# Patient Record
Sex: Female | Born: 1935 | Hispanic: No | Smoking: Never smoker
Health system: Southern US, Community
[De-identification: ages and names within clinical notes are randomized; demographics above are authoritative.]

## PROBLEM LIST (undated history)

## (undated) DIAGNOSIS — M1990 Unspecified osteoarthritis, unspecified site: Secondary | ICD-10-CM

## (undated) DIAGNOSIS — M549 Dorsalgia, unspecified: Secondary | ICD-10-CM

## (undated) DIAGNOSIS — M810 Age-related osteoporosis without current pathological fracture: Secondary | ICD-10-CM

## (undated) HISTORY — PX: JOINT REPLACEMENT: SHX530

## (undated) HISTORY — PX: BREAST SURGERY: SHX581

---

## 2005-03-03 ENCOUNTER — Emergency Department: Payer: Self-pay | Admitting: Emergency Medicine

## 2007-03-21 ENCOUNTER — Ambulatory Visit: Payer: Self-pay | Admitting: Orthopaedic Surgery

## 2007-11-22 ENCOUNTER — Emergency Department: Payer: Self-pay | Admitting: Internal Medicine

## 2009-09-03 ENCOUNTER — Emergency Department: Payer: Self-pay | Admitting: Emergency Medicine

## 2011-02-24 ENCOUNTER — Ambulatory Visit: Payer: Self-pay | Admitting: Ophthalmology

## 2011-03-08 ENCOUNTER — Ambulatory Visit: Payer: Self-pay | Admitting: Ophthalmology

## 2011-06-14 ENCOUNTER — Ambulatory Visit: Payer: Self-pay | Admitting: Ophthalmology

## 2012-06-27 ENCOUNTER — Emergency Department: Payer: Self-pay | Admitting: Emergency Medicine

## 2012-06-27 LAB — COMPREHENSIVE METABOLIC PANEL
Alkaline Phosphatase: 49 U/L — ABNORMAL LOW (ref 50–136)
Anion Gap: 8 (ref 7–16)
Bilirubin,Total: 0.3 mg/dL (ref 0.2–1.0)
Calcium, Total: 9.2 mg/dL (ref 8.5–10.1)
Chloride: 102 mmol/L (ref 98–107)
EGFR (Non-African Amer.): 40 — ABNORMAL LOW
Osmolality: 282 (ref 275–301)
SGOT(AST): 17 U/L (ref 15–37)
SGPT (ALT): 21 U/L (ref 12–78)
Sodium: 138 mmol/L (ref 136–145)
Total Protein: 6.9 g/dL (ref 6.4–8.2)

## 2012-06-27 LAB — TROPONIN I: Troponin-I: <0.02 ng/mL

## 2012-06-27 LAB — CBC
HCT: 38.6 % (ref 35.0–47.0)
MCH: 32.9 pg (ref 26.0–34.0)
MCHC: 33.6 g/dL (ref 32.0–36.0)
MCV: 98 fL (ref 80–100)
Platelet: 192 10*3/uL (ref 150–440)

## 2014-05-05 NOTE — Op Note (Signed)
PATIENT NAME:  Colleen Koch, Shanique J MR#:  [id number] DATE OF BIRTH:  August 22, 1925  DATE OF PROCEDURE:  06/14/2011  PREOPERATIVE DIAGNOSIS:  Cataract, right eye.   POSTOPERATIVE DIAGNOSIS:  Cataract, right eye.  PROCEDURE PERFORMED:  Extracapsular cataract extraction using phacoemulsification with placement of an Alcon SN6CWS, 18.5-diopter posterior chamber lens, serial [id number].  SURGEON:  Maylon PeppersSteven A. Lilian Fuhs, MD  ASSISTANT:  None.  ANESTHESIA:  4% lidocaine and 0.75% Marcaine in a 50/50 mixture with 10 units/mL of Vitrase added given as a peribulbar.  ANESTHESIOLOGIST:  Linward NatalAmy Rice, MD   COMPLICATIONS:  None.  ESTIMATED BLOOD LOSS:  Less than 1 mL.  DESCRIPTION OF PROCEDURE:  The patient was brought to the operating room and given a peribulbar block.  The patient was then prepped and draped in the usual fashion.  The vertical rectus muscles were imbricated using 5-0 silk sutures.  These sutures were then clamped to the sterile drapes as bridle sutures.  A limbal peritomy was performed extending two clock hours and hemostasis was obtained with cautery.  A partial thickness scleral groove was made at the surgical limbus and dissected anteriorly in a lamellar dissection using an Alcon crescent knife.  The anterior chamber was entered superonasally with a Superblade and through the lamellar dissection with a 2.6 mm keratome.  DisCoVisc was used to replace the aqueous and a continuous tear capsulorrhexis was carried out.  Hydrodissection and hydrodelineation were carried out with balanced salt and a 27 gauge canula.  The nucleus was rotated to confirm the effectiveness of the hydrodissection.  Phacoemulsification was carried out using a divide-and-conquer technique.  Total ultrasound time was 1 minute and 37 seconds with an average power of  15.7 percent, CDE of 23.83.  Irrigation/aspiration was used to remove the residual cortex.  DisCoVisc was used to inflate the capsule and the internal incision  was enlarged to 3 mm with the crescent knife.  The intraocular lens was folded and inserted into the capsular bag using the AcrySert Delivery System. Irrigation/aspiration was used to remove the residual DisCoVisc.  Miostat was injected into the anterior chamber through the paracentesis track to inflate the anterior chamber and induce miosis.  The wound was checked for leaks and none were found. The conjunctiva was closed with cautery and the bridle sutures were removed.  Two drops of 0.3% Vigamox were placed on the eye.   An eye shield was placed on the eye.  The patient was discharged to the recovery room in good condition.  ____________________________ Maylon PeppersSteven A. Liv Rallis, MD sad:cbb D: 06/14/2011 12:53:04 ET T: 06/14/2011 13:17:35 ET JOB#: [id number]  cc: Viviann SpareSteven A. Novelle Addair, MD, <Dictator> Erline LevineSTEVEN A Lynniah Janoski MD ELECTRONICALLY SIGNED 06/21/2011 13:31

## 2014-05-05 NOTE — Op Note (Signed)
PATIENT NAME:  Colleen Koch, [name] MR#:  [id number] DATE OF BIRTH:  1925-07-23  DATE OF PROCEDURE:  03/08/2011  PREOPERATIVE DIAGNOSIS:  Cataract, left eye.    POSTOPERATIVE DIAGNOSIS:  Cataract, left eye.  PROCEDURE PERFORMED:  Extracapsular cataract extraction using phacoemulsification with placement of an Alcon SN6CWS, 18.5-diopter posterior chamber lens, serial # [id number].  SURGEON:  Maylon PeppersSteven A. Akasia Ahmad, MD  ASSISTANT:  None.  ANESTHESIA:  4% lidocaine and 0.75% Marcaine in a 50/50 mixture with 10 units per mL of Hylenex , given as a peribulbar.  ANESTHESIOLOGIST:  Dr. Maisie Fushomas   COMPLICATIONS:  None.  ESTIMATED BLOOD LOSS:  Less than 1 mL.  DESCRIPTION OF PROCEDURE:  The patient was brought to the operating room and given a peribulbar block.  The patient was then prepped and draped in the usual fashion.  The vertical rectus muscles were imbricated using 5-0 silk sutures.  These sutures were then clamped to the sterile drapes as bridle sutures.  A limbal peritomy was performed extending two clock hours and hemostasis was obtained with cautery.  A partial thickness scleral groove was made at the surgical limbus and dissected anteriorly in a lamellar dissection using an Alcon crescent knife.  The anterior chamber was entered supero-temporally with a Superblade and through the lamellar dissection with a 2.6 mm keratome.  DisCoVisc was used to replace the aqueous and a continuous tear capsulorrhexis was carried out.  Hydrodissection and hydrodelineation were carried out with balanced salt and a 27 gauge canula.  The nucleus was rotated to confirm the effectiveness of the hydrodissection.  Phacoemulsification was carried out using a divide-and-conquer technique.  Total ultrasound time was 1 minute and 29 seconds with an average power of 10.5 percent, CDE 13.78.  Irrigation/aspiration was used to remove the residual cortex.  DisCoVisc was used to inflate the capsule and the internal incision was  enlarged to 3 mm with the crescent knife.  The intraocular lens was folded and inserted into the capsular bag using the Acrysert delivery system.  Irrigation/aspiration was used to remove the residual DisCoVisc.  Miostat was injected into the anterior chamber through the paracentesis track to inflate the anterior chamber and induce miosis.  The wound was checked for leaks and none were found. The conjunctiva was closed with cautery and the bridle sutures were removed.  Two drops of 0.3% Vigamox were placed on the eye.   An eye shield was placed on the eye.  The patient was discharged to the recovery room in good condition.  ____________________________ Maylon PeppersSteven A. Litisha Guagliardo, MD sad:drc D: 03/08/2011 12:49:38 ET T: 03/08/2011 13:04:49 ET JOB#: [id number]  cc: Viviann SpareSteven A. Wylee Dorantes, MD, <Dictator> Erline LevineSTEVEN A Breven Guidroz MD ELECTRONICALLY SIGNED 03/09/2011 15:08

## 2015-10-07 ENCOUNTER — Other Ambulatory Visit: Payer: Self-pay | Admitting: Physician Assistant

## 2015-10-07 DIAGNOSIS — M545 Low back pain: Secondary | ICD-10-CM

## 2015-10-18 ENCOUNTER — Encounter: Payer: Self-pay | Admitting: Radiology

## 2015-10-18 ENCOUNTER — Ambulatory Visit
Admission: RE | Admit: 2015-10-18 | Discharge: 2015-10-18 | Disposition: A | Discharge status: Home / Self Care | Payer: Medicare Other | Source: Ambulatory Visit | Attending: Physician Assistant | Admitting: Physician Assistant

## 2015-10-18 DIAGNOSIS — M4856XA Collapsed vertebra, not elsewhere classified, lumbar region, initial encounter for fracture: Secondary | ICD-10-CM | POA: Diagnosis not present

## 2015-10-18 DIAGNOSIS — M8938 Hypertrophy of bone, other site: Secondary | ICD-10-CM | POA: Diagnosis not present

## 2015-10-18 DIAGNOSIS — M5126 Other intervertebral disc displacement, lumbar region: Secondary | ICD-10-CM | POA: Diagnosis not present

## 2015-10-18 DIAGNOSIS — M545 Low back pain, unspecified: Secondary | ICD-10-CM

## 2016-07-11 ENCOUNTER — Emergency Department: Payer: Medicare Other

## 2016-07-11 ENCOUNTER — Emergency Department
Admission: EM | Admit: 2016-07-11 | Discharge: 2016-07-11 | Disposition: A | Discharge status: Home / Self Care | Payer: Medicare Other | Attending: Emergency Medicine | Admitting: Emergency Medicine

## 2016-07-11 ENCOUNTER — Encounter: Payer: Self-pay | Admitting: Emergency Medicine

## 2016-07-11 DIAGNOSIS — S81811A Laceration without foreign body, right lower leg, initial encounter: Secondary | ICD-10-CM | POA: Insufficient documentation

## 2016-07-11 DIAGNOSIS — Y999 Unspecified external cause status: Secondary | ICD-10-CM | POA: Insufficient documentation

## 2016-07-11 DIAGNOSIS — Y9301 Activity, walking, marching and hiking: Secondary | ICD-10-CM | POA: Insufficient documentation

## 2016-07-11 DIAGNOSIS — S0990XA Unspecified injury of head, initial encounter: Secondary | ICD-10-CM | POA: Diagnosis not present

## 2016-07-11 DIAGNOSIS — Y92012 Bathroom of single-family (private) house as the place of occurrence of the external cause: Secondary | ICD-10-CM | POA: Diagnosis not present

## 2016-07-11 DIAGNOSIS — W01198A Fall on same level from slipping, tripping and stumbling with subsequent striking against other object, initial encounter: Secondary | ICD-10-CM | POA: Insufficient documentation

## 2016-07-11 DIAGNOSIS — Z23 Encounter for immunization: Secondary | ICD-10-CM | POA: Insufficient documentation

## 2016-07-11 DIAGNOSIS — S8991XA Unspecified injury of right lower leg, initial encounter: Secondary | ICD-10-CM | POA: Diagnosis present

## 2016-07-11 HISTORY — DX: Dorsalgia, unspecified: M54.9

## 2016-07-11 MED ORDER — CLINDAMYCIN HCL 150 MG PO CAPS
300.0000 mg | ORAL_CAPSULE | Freq: Once | ORAL | Status: AC
  Administered 2016-07-11: 300 mg via ORAL
  Filled 2016-07-11: qty 2

## 2016-07-11 MED ORDER — CLINDAMYCIN HCL 300 MG PO CAPS: 300.0000 mg | ORAL_CAPSULE | Freq: Three times a day (TID) | ORAL | 0 refills | Status: AC

## 2016-07-11 MED ORDER — TETANUS-DIPHTH-ACELL PERTUSSIS 5-2.5-18.5 LF-MCG/0.5 IM SUSP
0.5000 mL | Freq: Once | INTRAMUSCULAR | Status: AC
  Administered 2016-07-11: 0.5 mL via INTRAMUSCULAR
  Filled 2016-07-11: qty 0.5

## 2016-07-11 NOTE — ED Notes (Addendum)
[phone]   Pt had this RN attempt both numbers, no answer at either

## 2016-07-11 NOTE — ED Notes (Signed)
Spoke w/ pts son on phone, stated that he would be able to pick pt up for D/C

## 2016-07-11 NOTE — ED Notes (Signed)
Patients son called for pick up

## 2016-07-11 NOTE — ED Provider Notes (Signed)
[facility] Emergency Department Provider Note  ____________________________________________  Time seen: Approximately 9:13 AM  I have reviewed the triage vital signs and the nursing notes.   HISTORY  Chief Complaint Laceration    HPI Colleen Koch is a 83 y.o. female that presents to the emergency department with right lower leg skin tear. Patient states that she tripped on a rug and hit her back on the water closet and her leg on the toilet. Patient states that she is extremely concerned about an infection. She states that she was hospitalized an unknown amount of years ago at [facility] for staph. Patient states that she is very worried about the dangers of MRSA. She is most concerned about all of the germs on the toilet seat that are now in her leg. She denies hitting head or losing consciousness. She denies shortness breath, chest pain, nausea, vomiting, abdominal pain.   Past Medical History:  Diagnosis Date  . Back pain     There are no active problems to display for this patient.   History reviewed. No pertinent surgical history.  Prior to Admission medications   Medication Sig Start Date End Date Taking? Authorizing Provider  clindamycin (CLEOCIN) 300 MG capsule Take 1 capsule (300 mg total) by mouth 3 (three) times daily. 07/11/16 07/21/16  Enid Derry, PA-C    Allergies Penicillins  History reviewed. No pertinent family history.  Social History Social History  Substance Use Topics  . Smoking status: Never Smoker  . Smokeless tobacco: Never Used  . Alcohol use Not on file     Review of Systems  Constitutional: No fever/chills Cardiovascular: No chest pain. Respiratory: No SOB. Gastrointestinal: No abdominal pain.  No nausea, no vomiting.  Musculoskeletal: Negative for musculoskeletal pain. Skin: Negative for rash, ecchymosis. Positive for abrasion.  Neurological: Negative for headaches, numbness or  tingling   ____________________________________________   PHYSICAL EXAM:  VITAL SIGNS: ED Triage Vitals  Enc Vitals Group     BP 07/11/16 0726 129/76     Pulse Rate 07/11/16 0726 73     Resp 07/11/16 0726 20     Temp 07/11/16 0726 97.8 F (36.6 C)     Temp Source 07/11/16 0726 Oral     SpO2 07/11/16 0726 97 %     Weight 07/11/16 0726 134 lb (60.8 kg)     Height 07/11/16 0726 5' 3.5" (1.613 m)     Head Circumference --      Peak Flow --      Pain Score 07/11/16 0732 0     Pain Loc --      Pain Edu? --      Excl. in GC? --      Constitutional: Alert and oriented. Talkative. Well appearing and in no acute distress. Eyes: Conjunctivae are normal. PERRL. EOMI. Head: Atraumatic. ENT:      Ears:      Nose: No congestion/rhinnorhea.      Mouth/Throat: Mucous membranes are moist.  Neck: No stridor.  Cardiovascular: Normal rate, regular rhythm.  Good peripheral circulation. Respiratory: Normal respiratory effort without tachypnea or retractions. Lungs CTAB. Good air entry to the bases with no decreased or absent breath sounds. Gastrointestinal: Bowel sounds 4 quadrants. Soft and nontender to palpation. No guarding or rigidity. No palpable masses. No distention. Musculoskeletal: Full range of motion to all extremities. No gross deformities appreciated. Neurologic:  Normal speech and language. No gross focal neurologic deficits are appreciated.  Skin:  Skin is warm, dry and  intact. 2 inch skin tear to right shin.    ____________________________________________   LABS (all labs ordered are listed, but only abnormal results are displayed)  Labs Reviewed - No data to display ____________________________________________  EKG   ____________________________________________  RADIOLOGY  Ct Head Wo Contrast  Result Date: 07/11/2016 CLINICAL DATA:  Fall in bathroom today. Head injury. Initial encounter. EXAM: CT HEAD WITHOUT CONTRAST TECHNIQUE: Contiguous axial images were  obtained from the base of the skull through the vertex without intravenous contrast. COMPARISON:  None. FINDINGS: Brain: No evidence of acute infarction, hemorrhage, hydrocephalus, extra-axial collection, or mass lesion/mass effect. Mild cerebral atrophy and chronic small vessel disease. Vascular:  No hyperdense vessel or other acute findings. Skull: No evidence of fracture or other significant bone abnormality. Sinuses/Orbits:  No acute findings. Other: None. IMPRESSION: No acute intracranial abnormality. Mild cerebral atrophy and chronic small vessel disease. Electronically Signed   By: Myles RosenthalJohn  Stahl M.D.   On: 07/11/2016 11:33    ____________________________________________    PROCEDURES  Procedure(s) performed:    Procedures  Wound was irrigated with 500 mL of normal saline and iodine. Skin flap was placed back over abrasion. Dermabond and Steri-Strips were applied to keep skin flap in place. I do not think that skin is thick enough for sutures. Bandage was applied and leg was wrapped.  Medications  Tdap (BOOSTRIX) injection 0.5 mL (0.5 mLs Intramuscular Given 07/11/16 0919)  clindamycin (CLEOCIN) capsule 300 mg (300 mg Oral Given 07/11/16 0919)     ____________________________________________   INITIAL IMPRESSION / ASSESSMENT AND PLAN / ED COURSE  Pertinent labs & imaging results that were available during my care of the patient were reviewed by me and considered in my medical decision making (see chart for details).  Review of the [place] CSRS was performed in accordance of the [facility] prior to dispensing any controlled drugs.  Patient's diagnosis is consistent with skin tear. Vital signs and exam are reassuring. After speaking with patient's son, he stated that patient has periods where she blacks out and is unlikely to be able to provide an accurate history. At this point CT head was ordered.  Skin flap was held in place with Dermabond and Steri-Strips. I do not think it is strong enough for  stitches. Bandage was applied and leg was wrapped. Tetanus shot was updated. Patient is very talkative and is eager to share stories and asked many questions. Patient will be discharged home with prescriptions for clindamycin. Patient is to follow up with PCP as directed. Patient is given ED precautions to return to the ED for any worsening or new symptoms.     ____________________________________________  FINAL CLINICAL IMPRESSION(S) / ED DIAGNOSES  Final diagnoses:  Noninfected skin tear of right lower extremity, initial encounter      NEW MEDICATIONS STARTED DURING THIS VISIT:  Discharge Medication List as of 07/11/2016 12:09 PM    START taking these medications   Details  clindamycin (CLEOCIN) 300 MG capsule Take 1 capsule (300 mg total) by mouth 3 (three) times daily., Starting Sun 07/11/2016, Until Wed 07/21/2016, Print            This chart was dictated using voice recognition software/Dragon. Despite best efforts to proofread, errors can occur which can change the meaning. Any change was purely unintentional.    Enid DerryWagner, Zaidyn Claire, PA-C 07/11/16 1345    Jene EveryKinner, Robert, MD 07/11/16 [phone]

## 2016-07-11 NOTE — ED Notes (Signed)
PA spoke w/ pts son.

## 2016-07-11 NOTE — ED Notes (Signed)
Patient transported to CT 

## 2016-07-11 NOTE — ED Notes (Signed)
See triage note...the patient c/o skin tear to posterior aspect of R calf. 2" skin tear noted, bleeding controlled and wound had been wrapped in triage. Pt A/OX4, drinking coffee. Pt denies n/v, LOC, dizziness or head injury. Pt sts fall mechanical in nature.  Motor function, sensation, circulation intact. Resp even and unlabored. NAD.

## 2016-07-11 NOTE — ED Triage Notes (Addendum)
Pt ambulatory to triage reports laceration to right lower leg that happened this morning at 5am.  Pt states the rug around the toilet seat slipped and she hit her back on the water closet and scraped her leg.  Pt concerned about infection in wound and placed H2O2 to cleanse. Pt is alert and oriented x 4. Pt denies any pain at this time. Wrapped leg in triage. 2 inch skin tear noted.  Bleeding controlled.

## 2016-07-28 ENCOUNTER — Emergency Department
Admission: EM | Admit: 2016-07-28 | Discharge: 2016-07-28 | Disposition: A | Discharge status: Home / Self Care | Payer: Medicare Other | Attending: Emergency Medicine | Admitting: Emergency Medicine

## 2016-07-28 DIAGNOSIS — Z4800 Encounter for change or removal of nonsurgical wound dressing: Secondary | ICD-10-CM | POA: Diagnosis present

## 2016-07-28 DIAGNOSIS — Z5321 Procedure and treatment not carried out due to patient leaving prior to being seen by health care provider: Secondary | ICD-10-CM | POA: Insufficient documentation

## 2016-07-28 HISTORY — DX: Unspecified osteoarthritis, unspecified site: M19.90

## 2016-07-28 NOTE — ED Triage Notes (Signed)
Pt states that she drove herself to the ER today.  Pt is a poor historian and unable to provide a good history or good information as to why she is here today.  Pt believes that she came to the ER for a wound recheck that she believes she was seen on June 1st for.  According to patient's chart pt was treated on July 1st for this wound.  Pt states adamently that she was seen on June 1st for this wound and was here all day.  Pt states that she has had [facility] in the [facility].  Pt very talkative and speaks in circles.  Unable to obtain any pertinent information from patient in triage.  Pt states that the wound's [facility], but that she doesn't believe that they have looked at the actual wound, but just changed the gauze outside of the wound.  Gauze is clean and dry with not drainage noted to bandage at this time.

## 2016-07-28 NOTE — ED Notes (Signed)
Pt is alert in lobby states she just needed her dressing changed on her leg  Pt drove herself and states she can be seen at urgent care in am

## 2016-10-03 ENCOUNTER — Emergency Department
Admission: EM | Admit: 2016-10-03 | Discharge: 2016-10-03 | Disposition: A | Discharge status: Home / Self Care | Payer: Medicare Other | Attending: Emergency Medicine | Admitting: Emergency Medicine

## 2016-10-03 DIAGNOSIS — T148XXA Other injury of unspecified body region, initial encounter: Secondary | ICD-10-CM

## 2016-10-03 DIAGNOSIS — Z4800 Encounter for change or removal of nonsurgical wound dressing: Secondary | ICD-10-CM | POA: Insufficient documentation

## 2016-10-03 DIAGNOSIS — Z5189 Encounter for other specified aftercare: Secondary | ICD-10-CM

## 2016-10-03 HISTORY — DX: Age-related osteoporosis without current pathological fracture: M81.0

## 2016-10-03 NOTE — ED Notes (Signed)
Pt reports that she fell this morning and injured the back of her left lower leg  - she states that the urgent care placed a bandage to her leg this morning but the area is still bleeding per pt so she came to the ER

## 2016-10-03 NOTE — ED Triage Notes (Signed)
Patient has skin tear to dorsal left leg. Patient was seen at [facility] today for same. Pt was unhappy with the quality of [facility]'s bandage/dressing. Pt re-bandaged wound. Pt continues to be unhappy with quality of bandage/dressing. Pt would like a new dressing/bandage.

## 2016-10-03 NOTE — ED Provider Notes (Signed)
[facility] Emergency Department Provider Note  ____________________________________________  Time seen: Approximately 11:08 PM  I have reviewed the triage vital signs and the nursing notes.   HISTORY  Chief Complaint Skin Tear    HPI Colleen Koch is a 83 y.o. female who presents emergency department complaining of skin tears to her left posterior leg. Patient reports that she suffered a fall with skin tears earlier today. She was seen in urgent care and dressing placed. Patient reports that the dressing fell off and was placed twice more. Patient reports the dressing has not stayed in place, can causing pain as well as some bleeding from the site. Patient denies any new injury. No other complaints at this time. Patient is requesting "a bandage that actually works."   Past Medical History:  Diagnosis Date  . Arthritis   . Back pain   . Osteoporosis     There are no active problems to display for this patient.   Past Surgical History:  Procedure Laterality Date  . BREAST SURGERY     implants  . JOINT REPLACEMENT      Prior to Admission medications   Not on File    Allergies Penicillins  No family history on file.  Social History Social History  Substance Use Topics  . Smoking status: Never Smoker  . Smokeless tobacco: Never Used  . Alcohol use No     Review of Systems  Constitutional: No fever/chills Eyes: No visual changes. Cardiovascular: no chest pain. Respiratory: no cough. No SOB. Gastrointestinal: No abdominal pain.  No nausea, no vomiting.   Musculoskeletal: Negative for musculoskeletal pain. Skin: positive for skin tears to the left posterior leg. Neurological: Negative for headaches, focal weakness or numbness. 10-point ROS otherwise negative.  ____________________________________________   PHYSICAL EXAM:  VITAL SIGNS: ED Triage Vitals  Enc Vitals Group     BP 10/03/16 2138 (!) 125/58     Pulse Rate 10/03/16  2138 83     Resp 10/03/16 2138 17     Temp 10/03/16 2138 98.1 F (36.7 C)     Temp Source 10/03/16 2138 Oral     SpO2 10/03/16 2138 94 %     Weight 10/03/16 2141 135 lb (61.2 kg)     Height --      Head Circumference --      Peak Flow --      Pain Score 10/03/16 2141 3     Pain Loc --      Pain Edu? --      Excl. in GC? --      Constitutional: Alert and oriented. Well appearing and in no acute distress. Eyes: Conjunctivae are normal. PERRL. EOMI. Head: Atraumatic. Neck: No stridor.    Cardiovascular: Normal rate, regular rhythm. Normal S1 and S2.  Good peripheral circulation. Respiratory: Normal respiratory effort without tachypnea or retractions. Lungs CTAB. Good air entry to the bases with no decreased or absent breath sounds. Musculoskeletal: Full range of motion to all extremities. No gross deformities appreciated. Neurologic:  Normal speech and language. No gross focal neurologic deficits are appreciated.  Skin:  Skin is warm, dry and intact. No rash noted.haphazard bandage noted to the left lower leg. This is removed to reveal multiple superficial skin tears over the posterior leg. No bleeding. No foreign body. No signs of infection. Skin tears start mid thigh and extend to mid calf. Psychiatric: Mood and affect are normal. Speech and behavior are normal. Patient exhibits appropriate insight and judgement.  ____________________________________________   LABS (all labs ordered are listed, but only abnormal results are displayed)  Labs Reviewed - No data to display ____________________________________________  EKG   ____________________________________________  RADIOLOGY   No results found.  ____________________________________________    PROCEDURES  Procedure(s) performed:    Procedures  The wound is cleansed thoroughly. Several areas covered with surgicept to promote wound healing. All lacerations covered with non-adherent dressing. Wrapped with ac  ebandage to keep nonadherent dressing inplace. Knee immobilizer placed to keep knee from bending and dressing from being removed. The patient is alerted to watch for any signs of infection (redness, pus, pain, increased swelling or fever) and call if such occurs. Home wound care instructions are provided.    Medications - No data to display   ____________________________________________   INITIAL IMPRESSION / ASSESSMENT AND PLAN / ED COURSE  Pertinent labs & imaging results that were available during my care of the patient were reviewed by me and considered in my medical decision making (see chart for details).  Review of the [place] CSRS was performed in accordance of the [facility] prior to dispensing any controlled drugs.     Patient's diagnosis is consistent with multiple skin tears with wound check. Area is thoroughly cleansed and redressed. No need for labs or imaging. No prescriptions at this time. Patient will follow up with primary care for further wound management. Patient is given ED precautions to return to the ED for any worsening or new symptoms.     ____________________________________________  FINAL CLINICAL IMPRESSION(S) / ED DIAGNOSES  Final diagnoses:  Multiple skin tears  Visit for wound check      NEW MEDICATIONS STARTED DURING THIS VISIT:  There are no discharge medications for this patient.       This chart was dictated using voice recognition software/Dragon. Despite best efforts to proofread, errors can occur which can change the meaning. Any change was purely unintentional.    Racheal Patches, PA-C 10/03/16 2350    Merrily Brittle, MD 10/04/16 1400

## 2016-12-07 ENCOUNTER — Emergency Department: Payer: Medicare Other

## 2016-12-07 ENCOUNTER — Other Ambulatory Visit: Payer: Self-pay

## 2016-12-07 ENCOUNTER — Emergency Department
Admission: EM | Admit: 2016-12-07 | Discharge: 2016-12-07 | Disposition: A | Discharge status: Home / Self Care | Payer: Medicare Other | Attending: Emergency Medicine | Admitting: Emergency Medicine

## 2016-12-07 DIAGNOSIS — I491 Atrial premature depolarization: Secondary | ICD-10-CM

## 2016-12-07 DIAGNOSIS — E039 Hypothyroidism, unspecified: Secondary | ICD-10-CM | POA: Diagnosis not present

## 2016-12-07 DIAGNOSIS — R531 Weakness: Secondary | ICD-10-CM | POA: Diagnosis present

## 2016-12-07 DIAGNOSIS — Z79899 Other long term (current) drug therapy: Secondary | ICD-10-CM | POA: Diagnosis not present

## 2016-12-07 LAB — CBC
HCT: 40.2 % (ref 35.0–47.0)
Hemoglobin: 13.3 g/dL (ref 12.0–16.0)
MCH: 32.1 pg (ref 26.0–34.0)
MCHC: 33.1 g/dL (ref 32.0–36.0)
MCV: 97.1 fL (ref 80.0–100.0)
PLATELETS: 201 10*3/uL (ref 150–440)
RBC: 4.14 MIL/uL (ref 3.80–5.20)
RDW: 12.9 % (ref 11.5–14.5)
WBC: 6.6 10*3/uL (ref 3.6–11.0)

## 2016-12-07 LAB — BASIC METABOLIC PANEL
Anion gap: 8 (ref 5–15)
BUN: 18 mg/dL (ref 6–20)
CALCIUM: 9.8 mg/dL (ref 8.9–10.3)
CHLORIDE: 104 mmol/L (ref 101–111)
CO2: 28 mmol/L (ref 22–32)
CREATININE: 1.01 mg/dL — AB (ref 0.44–1.00)
GFR calc Af Amer: 55 mL/min — ABNORMAL LOW (ref >60)
GFR calc non Af Amer: 48 mL/min — ABNORMAL LOW (ref >60)
GLUCOSE: 125 mg/dL — AB (ref 65–99)
Potassium: 4.4 mmol/L (ref 3.5–5.1)
Sodium: 140 mmol/L (ref 135–145)

## 2016-12-07 LAB — URINALYSIS, COMPLETE (UACMP) WITH MICROSCOPIC
Bacteria, UA: NONE SEEN
Bilirubin Urine: NEGATIVE
Glucose, UA: NEGATIVE mg/dL
Hgb urine dipstick: NEGATIVE
KETONES UR: NEGATIVE mg/dL
Leukocytes, UA: NEGATIVE
Nitrite: NEGATIVE
PH: 6 (ref 5.0–8.0)
PROTEIN: NEGATIVE mg/dL
Specific Gravity, Urine: 1.014 (ref 1.005–1.030)

## 2016-12-07 LAB — TROPONIN I: Troponin I: <0.03 ng/mL (ref <0.03)

## 2016-12-07 LAB — HEPATIC FUNCTION PANEL
ALT: 13 U/L — ABNORMAL LOW (ref 14–54)
AST: 17 U/L (ref 15–41)
Albumin: 3.8 g/dL (ref 3.5–5.0)
Alkaline Phosphatase: 45 U/L (ref 38–126)
BILIRUBIN DIRECT: 0.1 mg/dL (ref 0.1–0.5)
BILIRUBIN INDIRECT: 0.4 mg/dL (ref 0.3–0.9)
Total Bilirubin: 0.5 mg/dL (ref 0.3–1.2)
Total Protein: 7.1 g/dL (ref 6.5–8.1)

## 2016-12-07 LAB — T4, FREE: Free T4: 1.97 ng/dL — ABNORMAL HIGH (ref 0.61–1.12)

## 2016-12-07 LAB — TSH: TSH: 0.046 u[IU]/mL — ABNORMAL LOW (ref 0.350–4.500)

## 2016-12-07 MED ORDER — LEVOTHYROXINE SODIUM 50 MCG PO TABS: 50.0000 ug | ORAL_TABLET | Freq: Every day | ORAL | 1 refills | Status: AC

## 2016-12-07 MED ORDER — METOPROLOL TARTRATE 25 MG PO TABS
12.5000 mg | ORAL_TABLET | Freq: Two times a day (BID) | ORAL | 0 refills | Status: AC
Start: 2016-12-07 — End: 2017-12-07

## 2016-12-07 MED ORDER — METOPROLOL TARTRATE 25 MG PO TABS
12.5000 mg | ORAL_TABLET | Freq: Once | ORAL | Status: AC
  Administered 2016-12-07: 12.5 mg via ORAL
  Filled 2016-12-07: qty 1

## 2016-12-07 NOTE — Discharge Instructions (Signed)
Your EKG was concerning for an abnormal rhythm of your heart. Take metoprolol as prescribed. Reduce your dose of synthroid from 75 to 50 mcg daily as your hormone level is slightly elevated. Follow up with Cardiology in 2 days and with your doctor in 2 days. Return to the ER for chest pain, if you pass out, facial droop, slurred speech, unilateral weakness or numbness, fever, or any new symptoms concerning to you

## 2016-12-07 NOTE — ED Notes (Signed)
Patient's friend is here for transport. Patient was assisted to go to the room commode and get dressed.

## 2016-12-07 NOTE — ED Provider Notes (Signed)
[facility] Emergency Department Provider Note  ____________________________________________  Time seen: Approximately 1:51 PM  I have reviewed the triage vital signs and the nursing notes.   HISTORY  Chief Complaint Weakness   HPI Colleen Koch is a 83 y.o. female with a history of arthritis, osteoporosis, and hypothyroidism who presents for evaluation of generalized weakness. Patient reports that the symptoms have been ongoing for 2 years. She has several weekly episodes that she wakes up in the morning feeling very weak and has difficulty getting up from the bed. She has been seen by her primary care doctor with unclear etiology. Today she had similar episode and had a house guest who was concerned and brought her here for evaluation. Patient denies headache, URI symptoms, fever, cough, chest pain or shortness of breath, abdominal pain, nausea, vomiting, diarrhea, dysuria, rash. Patient reports that several days ago she fell onto her buttock region and is complaining of constant mild soreness in her coccyx region. No lower back pain, no saddle anesthesia, no urinary or bowel incontinence or retention. Patient denies unilateral weakness or numbness, facial droop, slurred speech, difficulty finding words.  Past Medical History:  Diagnosis Date  . Arthritis   . Back pain   . Osteoporosis     There are no active problems to display for this patient.   Past Surgical History:  Procedure Laterality Date  . BREAST SURGERY     implants  . JOINT REPLACEMENT      Prior to Admission medications   Medication Sig Start Date End Date Taking? Authorizing Provider  DULoxetine (CYMBALTA) 60 MG capsule Take 1 capsule by mouth daily. 01/20/14  Yes [provider]  meloxicam (MOBIC) 15 MG tablet Take 1 tablet by mouth daily.   Yes [provider]  simvastatin (ZOCOR) 20 MG tablet Take 1 tablet by mouth daily. 01/30/14  Yes [provider]    levothyroxine (SYNTHROID, LEVOTHROID) 50 MCG tablet Take 1 tablet (50 mcg total) by mouth daily. 12/07/16   [name], MD  metoprolol tartrate (LOPRESSOR) 25 MG tablet Take 0.5 tablets (12.5 mg total) by mouth 2 (two) times daily. 12/07/16 12/07/17  [name], MD    Allergies Penicillins  No family history on file.  Social History Social History   Tobacco Use  . Smoking status: Never Smoker  . Smokeless tobacco: Never Used  Substance Use Topics  . Alcohol use: No  . Drug use: Not on file    Review of Systems  Constitutional: Negative for fever. + generalized weakness Eyes: Negative for visual changes. ENT: Negative for sore throat. Neck: No neck pain  Cardiovascular: Negative for chest pain. Respiratory: Negative for shortness of breath. Gastrointestinal: Negative for abdominal pain, vomiting or diarrhea. Genitourinary: Negative for dysuria. Musculoskeletal: Negative for back pain. + coccyx pain Skin: Negative for rash. Neurological: Negative for headaches, weakness or numbness. Psych: No SI or HI  ____________________________________________   PHYSICAL EXAM:  VITAL SIGNS: ED Triage Vitals  Enc Vitals Group     BP 12/07/16 1105 137/85     Pulse Rate 12/07/16 1105 72     Resp 12/07/16 1105 18     Temp 12/07/16 1105 97.7 F (36.5 C)     Temp Source 12/07/16 1105 Oral     SpO2 12/07/16 1105 96 %     Weight 12/07/16 1106 134 lb (60.8 kg)     Height 12/07/16 1106 5\' 2"  (1.575 m)     Head Circumference --  Peak Flow --      Pain Score 12/07/16 1105 9     Pain Loc --      Pain Edu? --      Excl. in GC? --     Constitutional: Alert and oriented. Well appearing and in no apparent distress. HEENT:      Head: Normocephalic and atraumatic.         Eyes: Conjunctivae are normal. Sclera is non-icteric. R sided conjunctival hemorrhage      Mouth/Throat: Mucous membranes are moist.       Neck: Supple with no signs of  meningismus. Cardiovascular: Regular rate and rhythm. No murmurs, gallops, or rubs. 2+ symmetrical distal pulses are present in all extremities. No JVD. Respiratory: Normal respiratory effort. Lungs are clear to auscultation bilaterally. No wheezes, crackles, or rhonchi.  Gastrointestinal: Soft, non tender, and non distended with positive bowel sounds. No rebound or guarding. Musculoskeletal: Nontender with normal range of motion in all extremities. No edema, cyanosis, or erythema of extremities. Neurologic: Normal speech and language. A & O x3, PERRL, EOMI, no nystagmus, CN II-XII intact, motor testing reveals good tone and bulk throughout. There is no evidence of pronator drift or dysmetria. Muscle strength is 5/5 throughout. Sensory examination is intact. Gait deferred Skin: Skin is warm, dry and intact. No rash noted. Psychiatric: Mood and affect are normal. Speech and behavior are normal.  ____________________________________________   LABS (all labs ordered are listed, but only abnormal results are displayed)  Labs Reviewed  BASIC METABOLIC PANEL - Abnormal; Notable for the following components:      Result Value   Glucose, Bld 125 (*)    Creatinine, Ser 1.01 (*)    GFR calc non Af Amer 48 (*)    GFR calc Af Amer 55 (*)    All other components within normal limits  URINALYSIS, COMPLETE (UACMP) WITH MICROSCOPIC - Abnormal; Notable for the following components:   Color, Urine YELLOW (*)    APPearance CLEAR (*)    Squamous Epithelial / LPF 0-5 (*)    All other components within normal limits  TSH - Abnormal; Notable for the following components:   TSH 0.046 (*)    All other components within normal limits  T4, FREE - Abnormal; Notable for the following components:   Free T4 1.97 (*)    All other components within normal limits  HEPATIC FUNCTION PANEL - Abnormal; Notable for the following components:   ALT 13 (*)    All other components within normal limits  CBC  TROPONIN I   TROPONIN I  CBG MONITORING, ED   ____________________________________________  EKG  ED ECG REPORT I, Nita Sicklearolina Michaelyn Wall, the attending physician, personally viewed and interpreted this ECG.  Sinus rhythm with ectopic atrial rhythm, rate of 88, normal intervals, normal axis, no ST elevations or depressions. Afib is new when compared to prior.  15:05 - sinus rhythm with ectopic atrial rhythm, rate 78, normal intervals, normal axis, no ST elevations or depressions. ____________________________________________  RADIOLOGY  XR coccyx: negative ____________________________________________   PROCEDURES  Procedure(s) performed: None Procedures Critical Care performed:  None ____________________________________________   INITIAL IMPRESSION / ASSESSMENT AND PLAN / ED COURSE  83 y.o. female with a history of arthritis, osteoporosis, and hypothyroidism who presents for evaluation of generalized weakness x 2 years. patient is well-appearing, in no distress, normal vital signs, EKG showing new atrial ectopic rhythm with well-controlled rate, patient is neurologically intact with no evidence of stroke. we'll check TSH to rule  out thyroid abnormalities, we'll check basic labs to rule out anemia, electrolyte abnormalities, or ischemia. X-ray of the sacrum was negative for fracture. Will monitor on telemetry.    _________________________ 3:43 PM on 12/07/2016 -----------------------------------------  Labs showing slightly elevated free T4. We'll decrease patient's dose of levothyroxine. Patient has a new ectopic atrial rhythm on EKG. I discussed that with Dr. Juliann Paresallwood who agrees this is not afib and no anticoagulation is needed. He recommended starting patient on low dose BB 12.5mg  BID of metoprolol to help with atrial ectopy and f/u as outpatient. Patient was given first dose of metoprolol here and tolerated well. Will refer patient also to her PCP for further management.    As part of my  medical decision making, I reviewed the following data within the electronic MEDICAL RECORD NUMBER Nursing notes reviewed and incorporated, Labs reviewed , EKG interpreted , Old EKG reviewed, Old chart reviewed, A consult was requested and obtained from this/these consultant(s) cardiology, Notes from prior ED visits and [place] Controlled Substance Database    Pertinent labs & imaging results that were available during my care of the patient were reviewed by me and considered in my medical decision making (see chart for details).    ____________________________________________   FINAL CLINICAL IMPRESSION(S) / ED DIAGNOSES  Final diagnoses:  Atrial ectopy      NEW MEDICATIONS STARTED DURING THIS VISIT:  ED Discharge Orders        Ordered    levothyroxine (SYNTHROID, LEVOTHROID) 50 MCG tablet  Daily     12/07/16 1548    metoprolol tartrate (LOPRESSOR) 25 MG tablet  2 times daily     12/07/16 1548       Note:  This document was prepared using Dragon voice recognition software and may include unintentional dictation errors.    Don PerkingVeronese, [place], MD 12/07/16 [phone]

## 2016-12-07 NOTE — ED Notes (Signed)
Pt given warm blankets.

## 2016-12-07 NOTE — ED Notes (Signed)
First Nurse: pt c/o weakness for two years.

## 2016-12-07 NOTE — ED Triage Notes (Signed)
Pt c/o weakness and falling over the past year and has seen here PCP , when asked if there is anything new pt denies , states "Im limp as a dish rag". States her PCP "knows I flops around".

## 2016-12-07 NOTE — ED Notes (Signed)
Pt given meal tray and cup of water. Pt being transported to XR at this time.

## 2016-12-07 NOTE — ED Notes (Signed)
Room darkened per patient's request. NAD.

## 2016-12-07 NOTE — ED Notes (Signed)
Patient gave permission to call her friend Landry MellowSteve Layne to give her a ride home. Layne said he would be here under 30 minutes.

## 2017-03-06 ENCOUNTER — Other Ambulatory Visit: Payer: Self-pay

## 2017-03-06 ENCOUNTER — Emergency Department: Payer: Medicare Other

## 2017-03-06 ENCOUNTER — Emergency Department
Admission: EM | Admit: 2017-03-06 | Discharge: 2017-03-06 | Disposition: A | Discharge status: Home / Self Care | Payer: Medicare Other | Attending: Emergency Medicine | Admitting: Emergency Medicine

## 2017-03-06 ENCOUNTER — Encounter: Payer: Self-pay | Admitting: Emergency Medicine

## 2017-03-06 DIAGNOSIS — K859 Acute pancreatitis without necrosis or infection, unspecified: Secondary | ICD-10-CM | POA: Insufficient documentation

## 2017-03-06 DIAGNOSIS — Z79899 Other long term (current) drug therapy: Secondary | ICD-10-CM | POA: Diagnosis not present

## 2017-03-06 DIAGNOSIS — N390 Urinary tract infection, site not specified: Secondary | ICD-10-CM | POA: Insufficient documentation

## 2017-03-06 DIAGNOSIS — R112 Nausea with vomiting, unspecified: Secondary | ICD-10-CM

## 2017-03-06 DIAGNOSIS — R197 Diarrhea, unspecified: Secondary | ICD-10-CM | POA: Diagnosis not present

## 2017-03-06 LAB — URINALYSIS, COMPLETE (UACMP) WITH MICROSCOPIC
BILIRUBIN URINE: NEGATIVE
Glucose, UA: NEGATIVE mg/dL
Hgb urine dipstick: NEGATIVE
KETONES UR: NEGATIVE mg/dL
Nitrite: NEGATIVE
PROTEIN: 100 mg/dL — AB
Specific Gravity, Urine: 1.011 (ref 1.005–1.030)
pH: 6 (ref 5.0–8.0)

## 2017-03-06 LAB — CBC
HEMATOCRIT: 38.5 % (ref 35.0–47.0)
Hemoglobin: 12.8 g/dL (ref 12.0–16.0)
MCH: 32.1 pg (ref 26.0–34.0)
MCHC: 33.4 g/dL (ref 32.0–36.0)
MCV: 96.3 fL (ref 80.0–100.0)
Platelets: 201 10*3/uL (ref 150–440)
RBC: 4 MIL/uL (ref 3.80–5.20)
RDW: 14 % (ref 11.5–14.5)
WBC: 6.1 10*3/uL (ref 3.6–11.0)

## 2017-03-06 LAB — COMPREHENSIVE METABOLIC PANEL
ALT: 12 U/L — ABNORMAL LOW (ref 14–54)
AST: 20 U/L (ref 15–41)
Albumin: 4 g/dL (ref 3.5–5.0)
Alkaline Phosphatase: 38 U/L (ref 38–126)
Anion gap: 7 (ref 5–15)
BUN: 23 mg/dL — ABNORMAL HIGH (ref 6–20)
CHLORIDE: 103 mmol/L (ref 101–111)
CO2: 26 mmol/L (ref 22–32)
Calcium: 9.1 mg/dL (ref 8.9–10.3)
Creatinine, Ser: 0.89 mg/dL (ref 0.44–1.00)
GFR calc Af Amer: >60 mL/min (ref >60)
GFR, EST NON AFRICAN AMERICAN: 56 mL/min — AB (ref >60)
Glucose, Bld: 119 mg/dL — ABNORMAL HIGH (ref 65–99)
POTASSIUM: 3.9 mmol/L (ref 3.5–5.1)
SODIUM: 136 mmol/L (ref 135–145)
Total Bilirubin: 0.5 mg/dL (ref 0.3–1.2)
Total Protein: 7.1 g/dL (ref 6.5–8.1)

## 2017-03-06 LAB — LIPASE, BLOOD: Lipase: 137 U/L — ABNORMAL HIGH (ref 11–51)

## 2017-03-06 MED ORDER — SODIUM CHLORIDE 0.9 % IV BOLUS (SEPSIS)
1000.0000 mL | Freq: Once | INTRAVENOUS | Status: AC
  Administered 2017-03-06: 1000 mL via INTRAVENOUS

## 2017-03-06 MED ORDER — ONDANSETRON 4 MG PO TBDP: 4.0000 mg | ORAL_TABLET | Freq: Three times a day (TID) | ORAL | 0 refills | Status: AC | PRN

## 2017-03-06 MED ORDER — NITROFURANTOIN MONOHYD MACRO 100 MG PO CAPS
100.0000 mg | ORAL_CAPSULE | Freq: Once | ORAL | Status: AC
  Administered 2017-03-06: 100 mg via ORAL
  Filled 2017-03-06: qty 1

## 2017-03-06 MED ORDER — NITROFURANTOIN MONOHYD MACRO 100 MG PO CAPS: 100.0000 mg | ORAL_CAPSULE | Freq: Two times a day (BID) | ORAL | 0 refills | Status: AC

## 2017-03-06 NOTE — ED Notes (Signed)
Pt taken to car via Wheelchair. VSS. NAD. Discharge instructions, RX and follow up discussed with pt. All questions answered.

## 2017-03-06 NOTE — ED Triage Notes (Signed)
Pt arrives to triage with c/o N/V/D x 2 hours. Pt states "I just don't feel good but I didn't feel good to begin with". Pt is in NAD at this time.

## 2017-03-06 NOTE — ED Notes (Signed)
Patient states she came tonight because "I just didn't feel right".  Patient reports having some nausea, vomiting and diarrhea.  Patient states she has not vomited since coming to the ED, but has had some diarrhea x 3 since getting to the ED.  Patient denies any type of pain at this time.  Patient does report that she is incontinent of urine "I dribble".

## 2017-03-06 NOTE — ED Provider Notes (Signed)
[facility] Emergency Department Provider Note   ____________________________________________   First MD Initiated Contact with Patient 03/06/17 [phone]     (approximate)  I have reviewed the triage vital signs and the nursing notes.   HISTORY  Chief Complaint Emesis    HPI Colleen Koch is a 84 y.o. female who presents to the ED from home with a chief complaint of nausea, vomiting and diarrhea.  Patient thinks she has food poisoning from scrambled eggs she ate earlier in the evening.  Denies abdominal pain.  Denies associated fever, chills, chest pain, shortness of breath.  Has some dysuria.  Denies recent travel or trauma.  Denies recent antibiotic use.   Past Medical History:  Diagnosis Date  . Arthritis   . Back pain   . Osteoporosis     There are no active problems to display for this patient.   Past Surgical History:  Procedure Laterality Date  . BREAST SURGERY     implants  . JOINT REPLACEMENT      Prior to Admission medications   Medication Sig Start Date End Date Taking? Authorizing Provider  DULoxetine (CYMBALTA) 60 MG capsule Take 1 capsule by mouth daily. 01/20/14   [provider]  levothyroxine (SYNTHROID, LEVOTHROID) 50 MCG tablet Take 1 tablet (50 mcg total) by mouth daily. 12/07/16   [name], MD  meloxicam (MOBIC) 15 MG tablet Take 1 tablet by mouth daily.    [provider]  metoprolol tartrate (LOPRESSOR) 25 MG tablet Take 0.5 tablets (12.5 mg total) by mouth 2 (two) times daily. 12/07/16 12/07/17  [name], MD  nitrofurantoin, macrocrystal-monohydrate, (MACROBID) 100 MG capsule Take 1 capsule (100 mg total) by mouth 2 (two) times daily. 03/06/17   Irean Hong, MD  ondansetron (ZOFRAN ODT) 4 MG disintegrating tablet Take 1 tablet (4 mg total) by mouth every 8 (eight) hours as needed for nausea or vomiting. 03/06/17   Irean Hong, MD  simvastatin (ZOCOR) 20 MG tablet Take 1 tablet by mouth  daily. 01/30/14   [provider]    Allergies Penicillins  No family history on file.  Social History Social History   Tobacco Use  . Smoking status: Never Smoker  . Smokeless tobacco: Never Used  Substance Use Topics  . Alcohol use: No  . Drug use: No    Review of Systems  Constitutional: No fever/chills. Eyes: No visual changes. ENT: No sore throat. Cardiovascular: Denies chest pain. Respiratory: Denies shortness of breath. Gastrointestinal: No abdominal pain.  Positive for nausea, vomiting and diarrhea.  No constipation. Genitourinary: Negative for dysuria. Musculoskeletal: Negative for back pain. Skin: Negative for rash. Neurological: Negative for headaches, focal weakness or numbness.   ____________________________________________   PHYSICAL EXAM:  VITAL SIGNS: ED Triage Vitals  Enc Vitals Group     BP 03/06/17 0210 (!) 133/104     Pulse Rate 03/06/17 0210 72     Resp 03/06/17 0210 18     Temp 03/06/17 0210 (!) 97.5 F (36.4 C)     Temp Source 03/06/17 0210 Oral     SpO2 03/06/17 0210 96 %     Weight 03/06/17 0159 134 lb (60.8 kg)     Height 03/06/17 0159 5\' 3"  (1.6 m)     Head Circumference --      Peak Flow --      Pain Score 03/06/17 0159 0     Pain Loc --      Pain Edu? --  Excl. in GC? --     Constitutional: Alert and oriented. Well appearing and in no acute distress. Eyes: Conjunctivae are normal. PERRL. EOMI. Head: Atraumatic. Nose: No congestion/rhinnorhea. Mouth/Throat: Mucous membranes are mildly dry.  Oropharynx non-erythematous. Neck: No stridor.   Cardiovascular: Normal rate, regular rhythm. Grossly normal heart sounds.  Good peripheral circulation. Respiratory: Normal respiratory effort.  No retractions. Lungs CTAB. Gastrointestinal: Soft and minimally tender to palpation epigastrium without rebound or guarding. No distention. No abdominal bruits. No CVA tenderness. Musculoskeletal: No lower extremity tenderness nor  edema.  No joint effusions. Neurologic:  Normal speech and language. No gross focal neurologic deficits are appreciated. No gait instability. Skin:  Skin is warm, dry and intact. No rash noted. Psychiatric: Mood and affect are normal. Speech and behavior are normal.  ____________________________________________   LABS (all labs ordered are listed, but only abnormal results are displayed)  Labs Reviewed  LIPASE, BLOOD - Abnormal; Notable for the following components:      Result Value   Lipase 137 (*)    All other components within normal limits  COMPREHENSIVE METABOLIC PANEL - Abnormal; Notable for the following components:   Glucose, Bld 119 (*)    BUN 23 (*)    ALT 12 (*)    GFR calc non Af Amer 56 (*)    All other components within normal limits  URINALYSIS, COMPLETE (UACMP) WITH MICROSCOPIC - Abnormal; Notable for the following components:   Color, Urine YELLOW (*)    APPearance HAZY (*)    Protein, ur 100 (*)    Leukocytes, UA TRACE (*)    Bacteria, UA MANY (*)    Squamous Epithelial / LPF 0-5 (*)    All other components within normal limits  URINE CULTURE  CBC   ____________________________________________  EKG  ED ECG REPORT I, Haley Roza J, the attending physician, personally viewed and interpreted this ECG.   Date: 03/06/2017  EKG Time: 0200  Rate: 70  Rhythm: normal EKG, normal sinus rhythm  Axis: Normal  Intervals:none  ST&T Change: Nonspecific  ____________________________________________  RADIOLOGY  ED MD interpretation: Ultrasound pending  Official radiology report(s): No results found.  ____________________________________________   PROCEDURES  Procedure(s) performed: None  Procedures  Critical Care performed: No  ____________________________________________   INITIAL IMPRESSION / ASSESSMENT AND PLAN / ED COURSE  As part of my medical decision making, I reviewed the following data within the electronic MEDICAL RECORD NUMBER Nursing  notes reviewed and incorporated, Labs reviewed, EKG interpreted, Old chart reviewed and Notes from prior ED visits.   84 year old female who presents with nausea, vomiting and diarrhea. Differential diagnosis includes, but is not limited to, biliary disease (biliary colic, acute cholecystitis, cholangitis, choledocholithiasis, etc), intrathoracic causes for epigastric abdominal pain including ACS, gastritis, duodenitis, pancreatitis, small bowel or large bowel obstruction, abdominal aortic aneurysm, hernia, gastroenteritis and gastritis.  Patient has waited over 4 hours for treatment room and has not had vomiting in that time.  Nor has she had diarrhea.  Laboratory and urinalysis results remarkable for elevated lipase and mild UTI.  Will initiate IV fluid resuscitation, Macrobid for UTI, IV antiemetic and obtain right upper quadrant abdominal ultrasound to evaluate for cholecystitis.  Care will be transferred to the oncoming provider.  If ultrasound is unremarkable and patient remains well-appearing, anticipate she would be able to be discharged home on antibiotics and antiemetic.      ____________________________________________   FINAL CLINICAL IMPRESSION(S) / ED DIAGNOSES  Final diagnoses:  Nausea vomiting and diarrhea  Lower  urinary tract infectious disease  Acute pancreatitis without infection or necrosis, unspecified pancreatitis type     ED Discharge Orders        Ordered    nitrofurantoin, macrocrystal-monohydrate, (MACROBID) 100 MG capsule  2 times daily     03/06/17 0643    ondansetron (ZOFRAN ODT) 4 MG disintegrating tablet  Every 8 hours PRN     03/06/17 [id number]       Note:  This document was prepared using Dragon voice recognition software and may include unintentional dictation errors.    Irean HongSung, Norrin Shreffler J, MD 03/06/17 [phone]

## 2017-03-06 NOTE — ED Notes (Signed)
Pt assisted to bathroom at this time. Pt is able to ambulate without difficulty to and from the toilet.

## 2017-03-06 NOTE — Discharge Instructions (Addendum)
From Dr. Dolores FrameSung  1. Take antibiotic as prescribed (Macrobid 100mg  twice daily x 5 days). 2. You may take nausea medicine as needed (Zofran). 3.  Clear liquids for the next 12 hours, then bland diet for the next 3 days.  Avoid greasy, fatty, spicy foods and alcohol. 4.  Return to the ER for worsening symptoms, persistent vomiting, difficulty breathing or other concerns.

## 2017-03-08 LAB — URINE CULTURE: Culture: >=100000 — AB

## 2017-07-01 ENCOUNTER — Emergency Department: Payer: Medicare Other

## 2017-07-01 ENCOUNTER — Other Ambulatory Visit: Payer: Self-pay

## 2017-07-01 ENCOUNTER — Emergency Department
Admission: EM | Admit: 2017-07-01 | Discharge: 2017-07-01 | Disposition: A | Discharge status: Home / Self Care | Payer: Medicare Other | Attending: Emergency Medicine | Admitting: Emergency Medicine

## 2017-07-01 DIAGNOSIS — W19XXXA Unspecified fall, initial encounter: Secondary | ICD-10-CM | POA: Insufficient documentation

## 2017-07-01 DIAGNOSIS — R0789 Other chest pain: Secondary | ICD-10-CM | POA: Diagnosis not present

## 2017-07-01 DIAGNOSIS — Y92009 Unspecified place in unspecified non-institutional (private) residence as the place of occurrence of the external cause: Secondary | ICD-10-CM | POA: Diagnosis not present

## 2017-07-01 DIAGNOSIS — E039 Hypothyroidism, unspecified: Secondary | ICD-10-CM | POA: Insufficient documentation

## 2017-07-01 DIAGNOSIS — Y999 Unspecified external cause status: Secondary | ICD-10-CM | POA: Insufficient documentation

## 2017-07-01 DIAGNOSIS — S0990XA Unspecified injury of head, initial encounter: Secondary | ICD-10-CM

## 2017-07-01 DIAGNOSIS — Z79899 Other long term (current) drug therapy: Secondary | ICD-10-CM | POA: Insufficient documentation

## 2017-07-01 DIAGNOSIS — Y939 Activity, unspecified: Secondary | ICD-10-CM | POA: Diagnosis not present

## 2017-07-01 LAB — URINALYSIS, COMPLETE (UACMP) WITH MICROSCOPIC
Bilirubin Urine: NEGATIVE
GLUCOSE, UA: NEGATIVE mg/dL
HGB URINE DIPSTICK: NEGATIVE
KETONES UR: NEGATIVE mg/dL
Leukocytes, UA: NEGATIVE
NITRITE: NEGATIVE
PROTEIN: NEGATIVE mg/dL
Specific Gravity, Urine: 1.013 (ref 1.005–1.030)
pH: 8 (ref 5.0–8.0)

## 2017-07-01 LAB — CBC
HCT: 36.6 % (ref 35.0–47.0)
HEMOGLOBIN: 12.6 g/dL (ref 12.0–16.0)
MCH: 33.4 pg (ref 26.0–34.0)
MCHC: 34.5 g/dL (ref 32.0–36.0)
MCV: 96.8 fL (ref 80.0–100.0)
Platelets: 204 10*3/uL (ref 150–440)
RBC: 3.78 MIL/uL — AB (ref 3.80–5.20)
RDW: 13.6 % (ref 11.5–14.5)
WBC: 7.5 10*3/uL (ref 3.6–11.0)

## 2017-07-01 LAB — BASIC METABOLIC PANEL
ANION GAP: 8 (ref 5–15)
BUN: 12 mg/dL (ref 6–20)
CHLORIDE: 104 mmol/L (ref 101–111)
CO2: 28 mmol/L (ref 22–32)
Calcium: 9.4 mg/dL (ref 8.9–10.3)
Creatinine, Ser: 0.92 mg/dL (ref 0.44–1.00)
GFR calc non Af Amer: 53 mL/min — ABNORMAL LOW (ref >60)
Glucose, Bld: 93 mg/dL (ref 65–99)
POTASSIUM: 4.3 mmol/L (ref 3.5–5.1)
SODIUM: 140 mmol/L (ref 135–145)

## 2017-07-01 MED ORDER — ACETAMINOPHEN 500 MG PO TABS
1000.0000 mg | ORAL_TABLET | Freq: Once | ORAL | Status: AC
  Administered 2017-07-01: 1000 mg via ORAL
  Filled 2017-07-01: qty 2

## 2017-07-01 NOTE — Discharge Instructions (Signed)
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
## 2017-07-01 NOTE — Clinical Social Work Note (Addendum)
CSW received a phone call from [facility] Social Worker [place] [phone]), who stated patient just came to the ED because of chest pains and a recent fall. Ms. Cliffton AstersWhite concerned about possible broken ribs. Ms. Cliffton AstersWhite stated patient from home with son, but son does not provide care for the patient. Ms. Cliffton AstersWhite states patient has been having more frequent falls in the home and has not been taking her medication. Ms. Cliffton AstersWhite stated she does not feel patient is safe returning home. Ms. Cliffton AstersWhite stated patient's other son/POA-Steve Stubblefield [phone]) is on the way from his home in [place] and plans to place patient in an Assisted Living Facility.   CSW explained that at this time patient just arrived and no plan of care is available at this time. CSW also explained that at the point of discharge there would need to be a plan in place for patient. Ms. Cliffton AstersWhite expressed understanding. Patient does not have Medicaid, but does have traditional Medicare, which requires a 3 midnight, qualifying inpatient stay to cover short term rehab in a Skilled Nursing Facility (SNF). CSW updated EDP Dr. Don PerkingVeronese.   2:40pm - CSW received a call from EDP Dr. Don PerkingVeronese stating patient is medically cleared for discharge. CSW called patient's son/POA-Steve Stubblefield ([phone]), who stated he has been traveling to [place] since he "got the call this morning and [place] right now, which [place] about 6 hours away." Mr. Mable ParisStubblefield stated patient's youngest brother-Tim Yetta BarreJones is on the way to the hospital from [place] now to transport patient home. Mr. Mable ParisStubblefield stated Mr. Yetta BarreJones will be at the home with patient until Mr. Mable ParisStubblefield arrives. CSW informed Mr. Mable ParisStubblefield that CSW would inform APS SW Ms. White of the discharge plan. CSW provided APS SW Ms. White's phone number to Mr. Mable ParisStubblefield, at his request. CSW informed patient that placement resources would be  provided to patient upon discharge, for Mr. Effie ShyStubblefield's review to assist with the placement process. Mr. Mable ParisStubblefield appreciative of CSW efforts. CSW updated APS SW Ms. White, who is agreeable to discharge plan. CSW updated EDRN Gwendolyn and EDP Dr. Don PerkingVeronese. CSW placed placement process resources and Assisted Living Facility Listing with discharge paperwork. CSW signing off as no further Social Work intervention needed.    Corlis HoveJeneya Charie Pinkus, Theresia MajorsLCSWA, [facility] Clinical Social Worker-Emergency Department [phone]

## 2017-07-03 LAB — URINE CULTURE

## 2018-11-22 IMAGING — CR DG SACRUM/COCCYX 2+V
3 series · 3 of 3 positions shown · non-contrast
Comparison: None.

CLINICAL DATA: Weakness and multiple falls for 1 year

EXAM:
SACRUM AND COCCYX - 2+ VIEW

[coccyx ap]
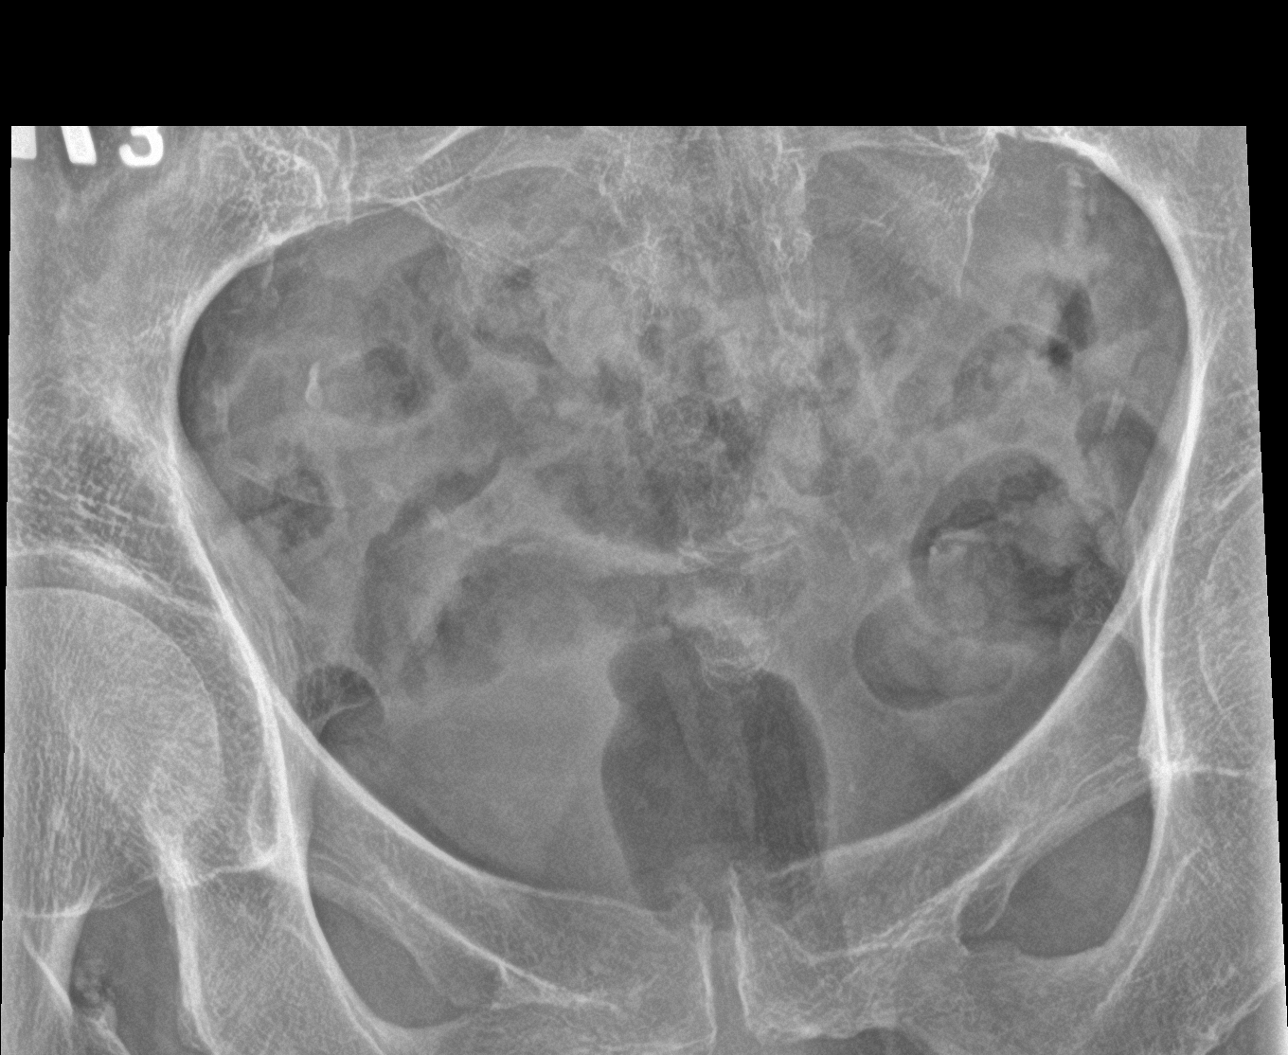

[sacrum ap]
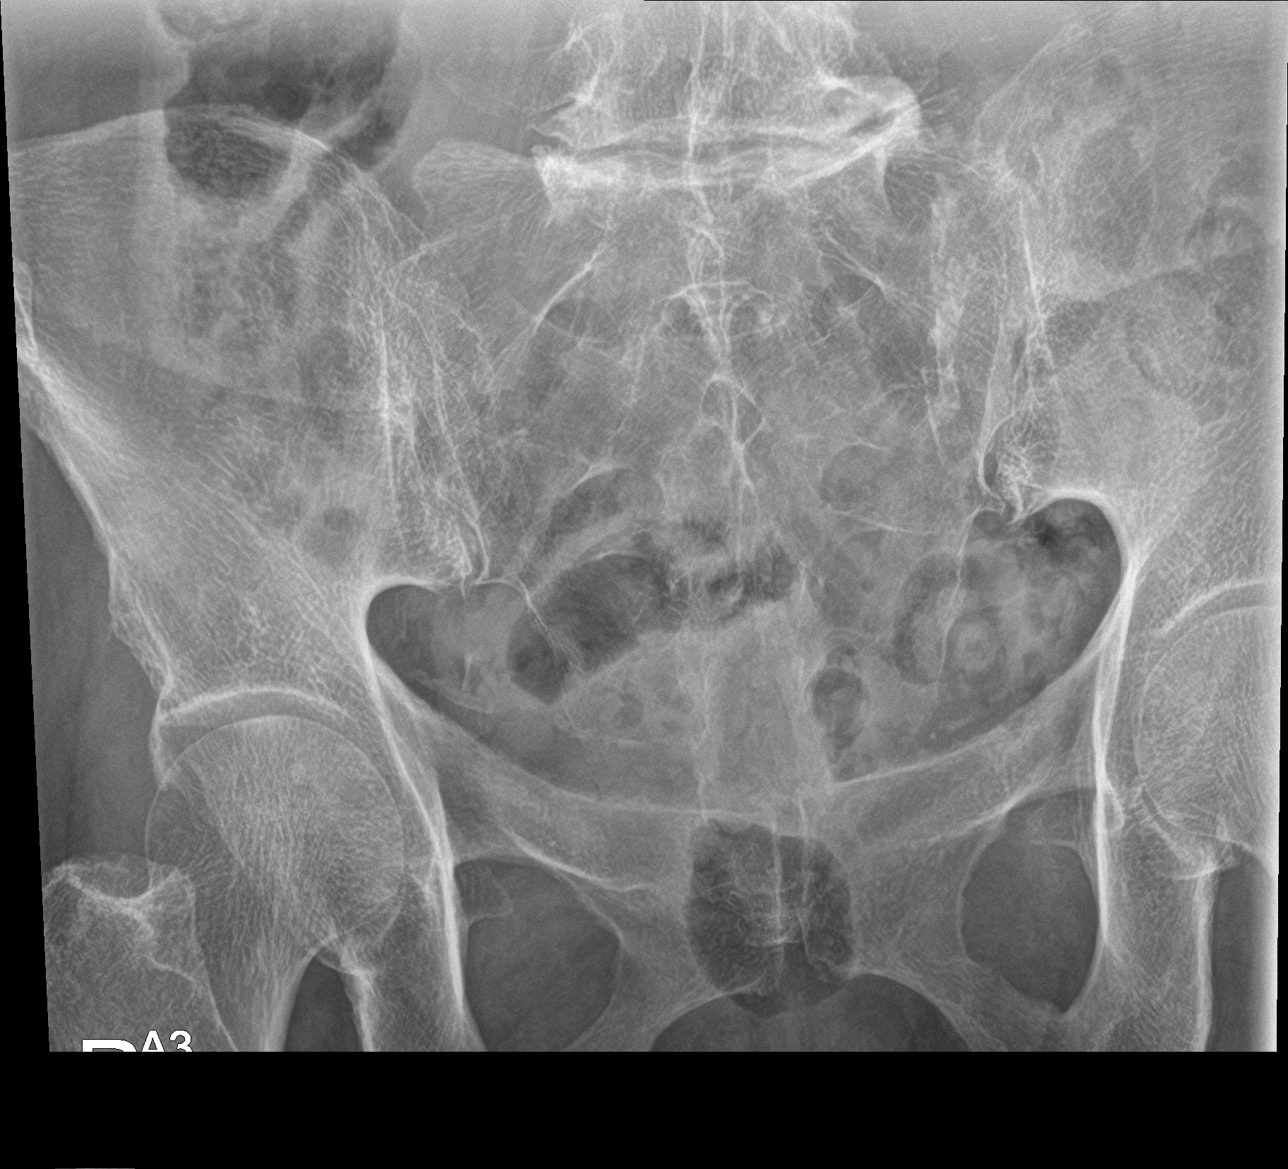

[sacrum lat]
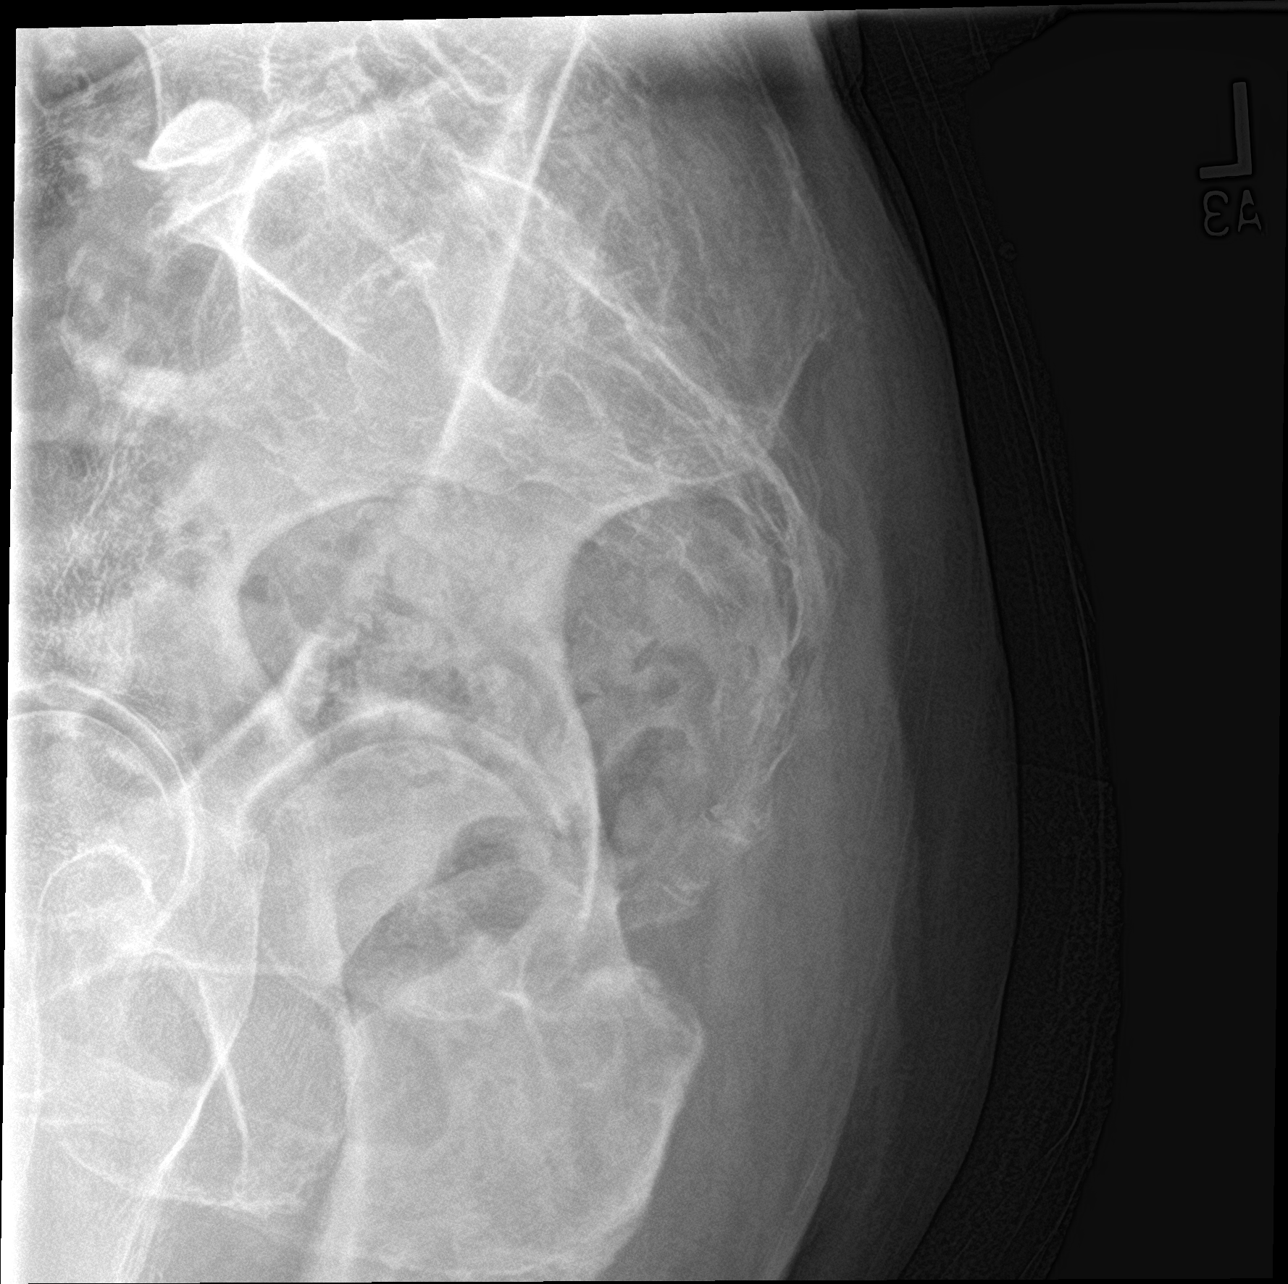

[3 of 3 positions shown; findings below may reference images not displayed]

FINDINGS: No fracture subluxation. No SI joint widening or erosive changes. No
other bone abnormalities are seen. Degenerative disc disease with
disc height loss at L5-S1.
IMPRESSION: No acute osseous injury of the sacrum and coccyx.

## 2019-04-12 DEATH — deceased

## 2019-06-16 IMAGING — CR DG SHOULDER 2+V*R*
3 series · 3 of 3 positions shown · non-contrast
Comparison: None

CLINICAL DATA: Several recent falls, pain in RIGHT shoulder and
anterior upper RIGHT ribs

EXAM:
RIGHT SHOULDER - 2+ VIEW

[shoulder grashey]
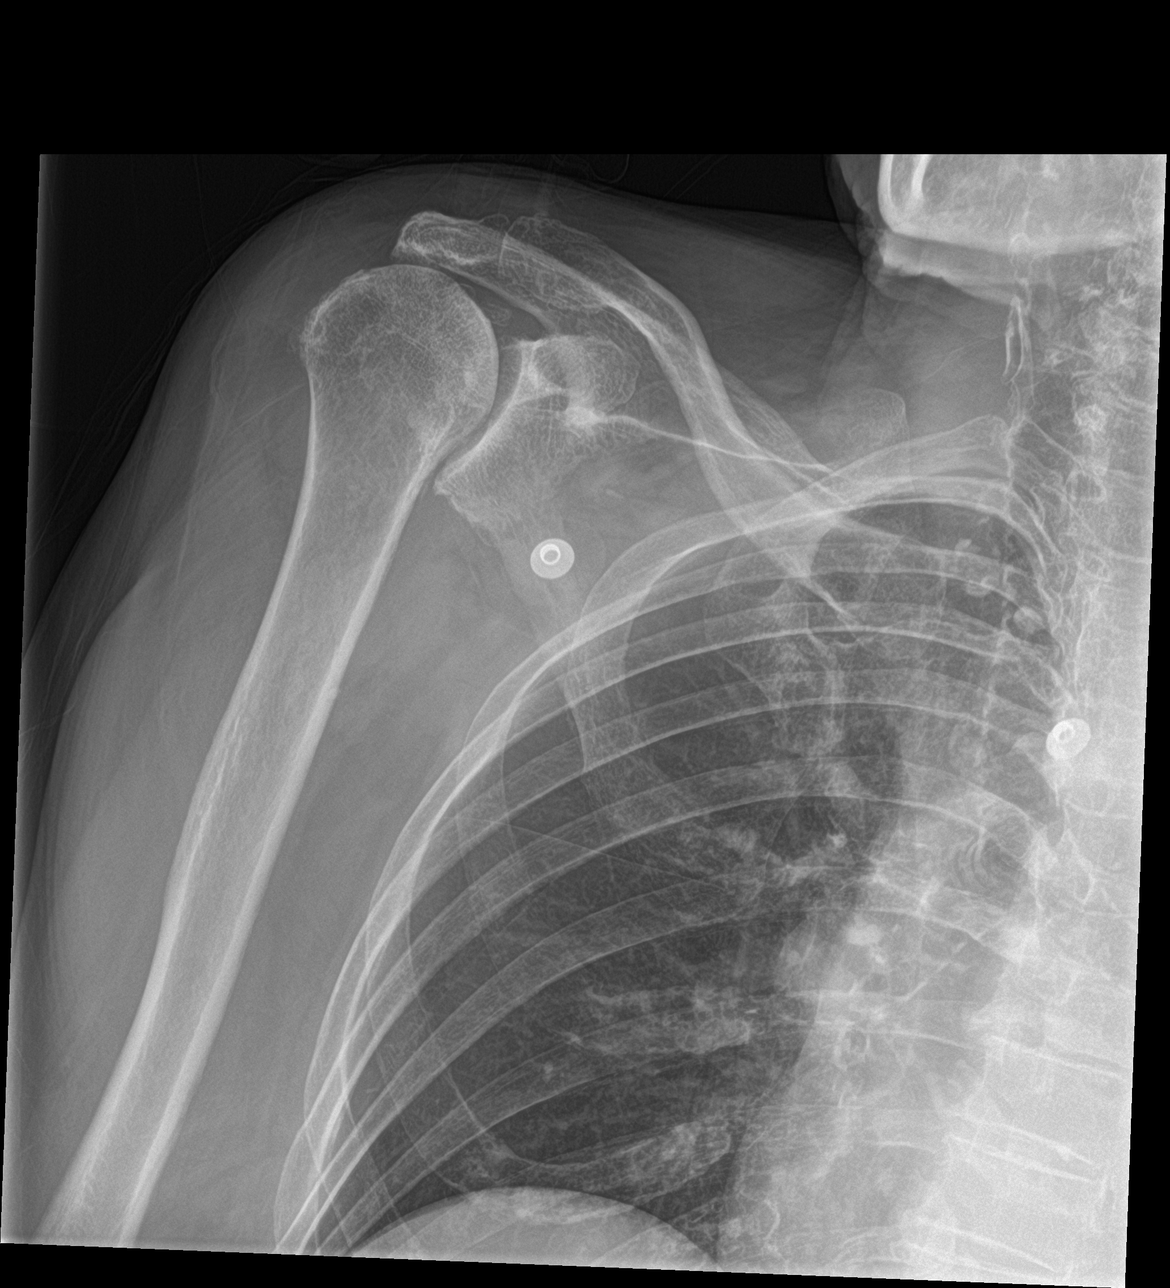

[shoulder y view]
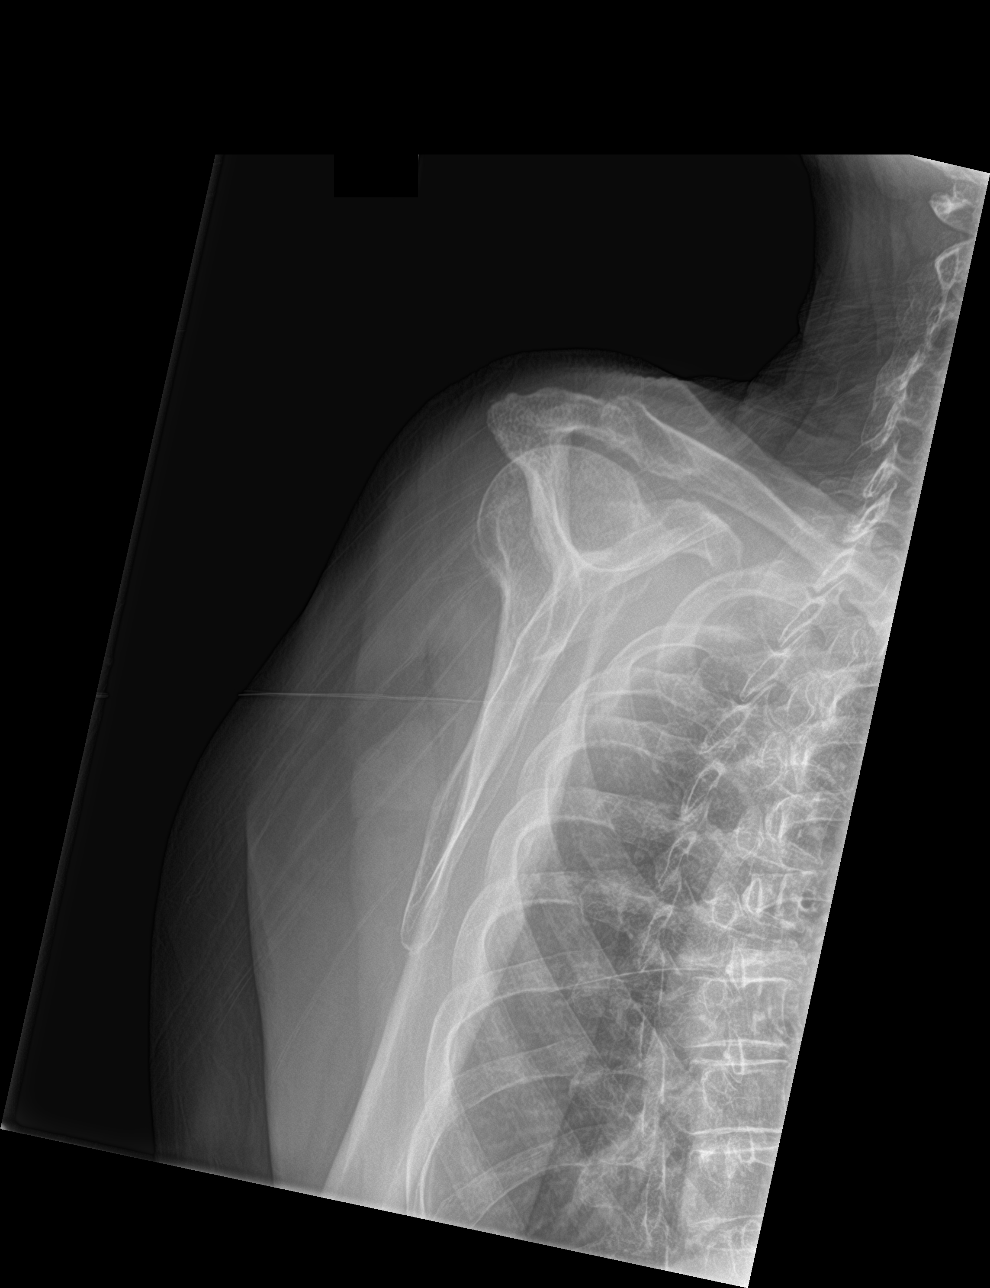

[shoulder axillary]
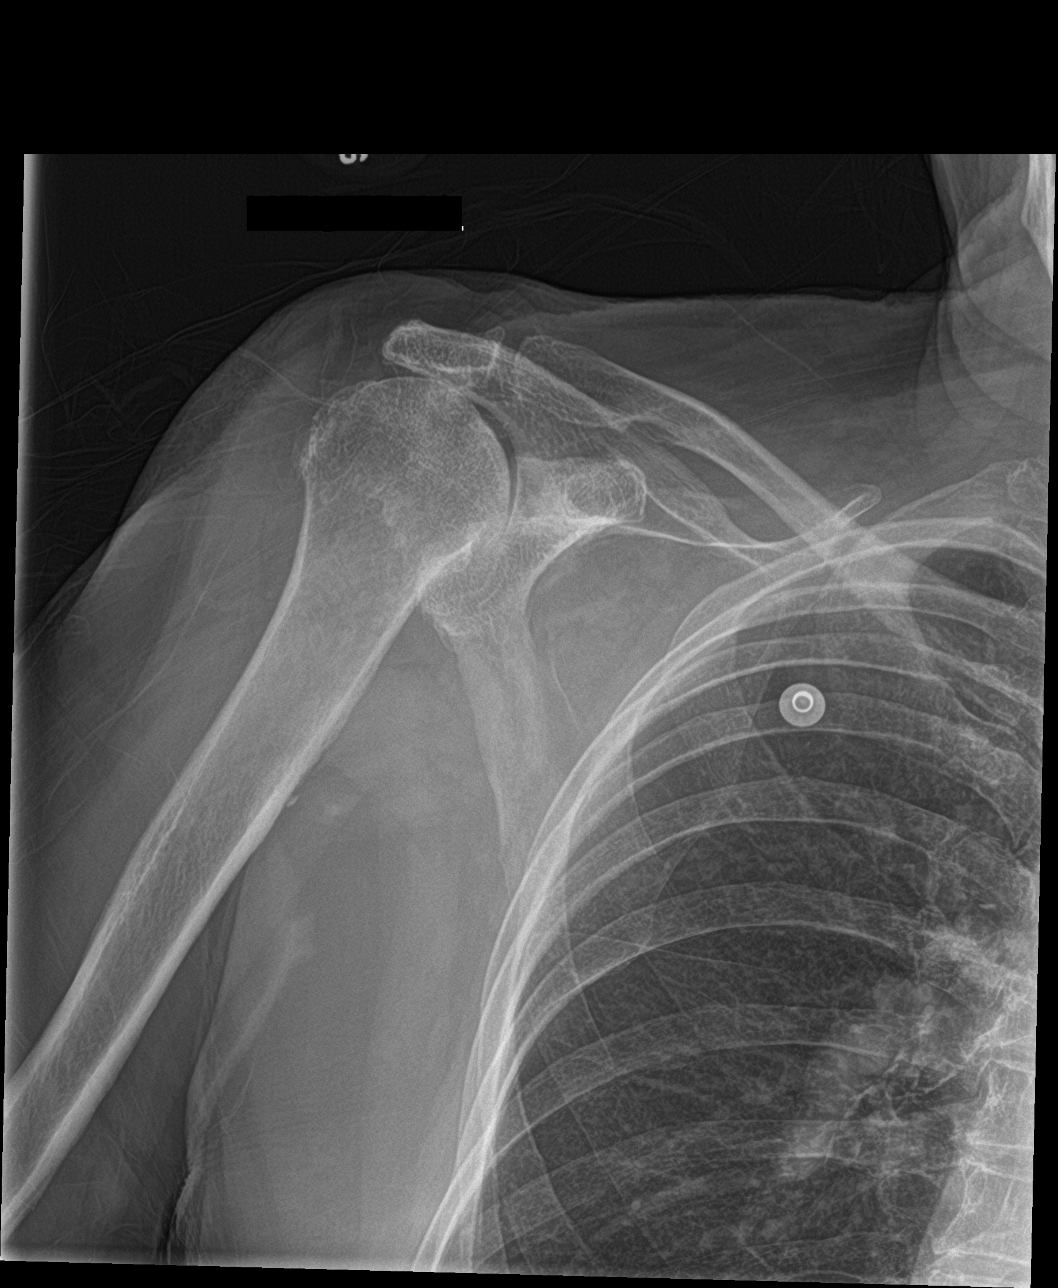

[3 of 3 positions shown; findings below may reference images not displayed]

FINDINGS: Osseous demineralization.

AC joint alignment normal.

RIGHT glenohumeral degenerative changes with joint space narrowing
and minimal spurring.

Question small calcific bodies adjacent to superior margin of the
glenohumeral joint.

RIGHT humeral head approximates undersurface of the acromion
suggesting chronic rotator cuff tear.

No acute fracture, dislocation, or bone destruction.
IMPRESSION: RIGHT glenohumeral degenerative changes with suspected small
calcified loose body superior to the glenohumeral joint.

Probable chronic rotator cuff tear.
